# Patient Record
Sex: Male | Born: 1975 | Race: Asian | Hispanic: No | Marital: Married
Health system: Southern US, Community
[De-identification: ages and names within clinical notes are randomized; demographics above are authoritative.]

---

## 2009-12-01 ENCOUNTER — Ambulatory Visit: Payer: Self-pay | Admitting: Gastroenterology

## 2010-01-12 ENCOUNTER — Ambulatory Visit: Payer: Self-pay | Admitting: Gastroenterology

## 2011-08-15 ENCOUNTER — Other Ambulatory Visit: Payer: Self-pay | Admitting: *Deleted

## 2011-08-15 DIAGNOSIS — B181 Chronic viral hepatitis B without delta-agent: Secondary | ICD-10-CM

## 2011-08-17 ENCOUNTER — Ambulatory Visit
Admission: RE | Admit: 2011-08-17 | Discharge: 2011-08-17 | Disposition: A | Discharge status: Home / Self Care | Payer: BC Managed Care – PPO | Source: Ambulatory Visit | Attending: *Deleted | Admitting: *Deleted

## 2011-08-17 DIAGNOSIS — B181 Chronic viral hepatitis B without delta-agent: Secondary | ICD-10-CM

## 2011-09-20 ENCOUNTER — Ambulatory Visit: Payer: BC Managed Care – PPO | Admitting: Gastroenterology

## 2013-01-20 ENCOUNTER — Other Ambulatory Visit: Payer: Self-pay | Admitting: Internal Medicine

## 2013-01-20 DIAGNOSIS — K760 Fatty (change of) liver, not elsewhere classified: Secondary | ICD-10-CM

## 2013-01-20 DIAGNOSIS — B181 Chronic viral hepatitis B without delta-agent: Secondary | ICD-10-CM

## 2013-01-21 ENCOUNTER — Ambulatory Visit
Admission: RE | Admit: 2013-01-21 | Discharge: 2013-01-21 | Disposition: A | Discharge status: Home / Self Care | Payer: BC Managed Care – PPO | Source: Ambulatory Visit | Attending: Internal Medicine | Admitting: Internal Medicine

## 2013-01-21 DIAGNOSIS — B181 Chronic viral hepatitis B without delta-agent: Secondary | ICD-10-CM

## 2013-01-21 DIAGNOSIS — K760 Fatty (change of) liver, not elsewhere classified: Secondary | ICD-10-CM

## 2015-05-31 ENCOUNTER — Other Ambulatory Visit: Payer: Self-pay | Admitting: Gastroenterology

## 2015-05-31 DIAGNOSIS — B181 Chronic viral hepatitis B without delta-agent: Secondary | ICD-10-CM

## 2015-06-07 ENCOUNTER — Ambulatory Visit
Admission: RE | Admit: 2015-06-07 | Discharge: 2015-06-07 | Disposition: A | Discharge status: Home / Self Care | Payer: BC Managed Care – PPO | Source: Ambulatory Visit | Attending: Gastroenterology | Admitting: Gastroenterology

## 2015-06-07 DIAGNOSIS — B181 Chronic viral hepatitis B without delta-agent: Secondary | ICD-10-CM

## 2017-07-09 ENCOUNTER — Other Ambulatory Visit: Payer: Self-pay | Admitting: Family Medicine

## 2017-07-09 DIAGNOSIS — B181 Chronic viral hepatitis B without delta-agent: Secondary | ICD-10-CM

## 2017-09-02 ENCOUNTER — Ambulatory Visit
Admission: RE | Admit: 2017-09-02 | Discharge: 2017-09-02 | Disposition: A | Discharge status: Home / Self Care | Payer: BC Managed Care – PPO | Source: Ambulatory Visit | Attending: Family Medicine | Admitting: Family Medicine

## 2017-09-02 DIAGNOSIS — B181 Chronic viral hepatitis B without delta-agent: Secondary | ICD-10-CM

## 2019-12-05 ENCOUNTER — Ambulatory Visit: Payer: BC Managed Care – PPO | Attending: Internal Medicine

## 2019-12-05 DIAGNOSIS — Z23 Encounter for immunization: Secondary | ICD-10-CM | POA: Insufficient documentation

## 2019-12-05 NOTE — Progress Notes (Signed)
   Cov[facility] Vaccination [facility]  Name:  Bedford Milles    MRN: [id number] DOB: [birth year]  12/05/2019  Mr. Macari was observed post Covid-19 immunization for 15 minutes without incident. He was provided with Vaccine Information Sheet and instruction to access the V-Safe system.   Mr. Dedo was instructed to call 911 with any severe reactions post vaccine: Marland Kitchen Difficulty breathing  . Swelling of face and throat  . A fast heartbeat  . A bad rash all over body  . Dizziness and weakness   Immunizations Administered    Name Date Dose VIS Date Route   Pfizer COVID-19 Vaccine 12/05/2019 11:19 AM 0.3 mL 09/11/2019 Intramuscular   Manufacturer: [place]   Lot: [id number]   [place]: [id number]

## 2019-12-26 ENCOUNTER — Ambulatory Visit: Payer: BC Managed Care – PPO | Attending: Internal Medicine

## 2019-12-26 DIAGNOSIS — Z23 Encounter for immunization: Secondary | ICD-10-CM

## 2019-12-26 NOTE — Progress Notes (Signed)
   Covid-19 Vaccination Clinic  Name:  Robert Cannon    MRN: [id number] DOB: 11-Aug-1976  12/26/2019  [name] was observed post Covid-19 immunization for 15 minutes without incident. He was provided with Vaccine Information Sheet and instruction to access the V-Safe system.   [name] was instructed to call 911 with any severe reactions post vaccine: Marland Kitchen Difficulty breathing  . Swelling of face and throat  . A fast heartbeat  . A bad rash all over body  . Dizziness and weakness   Immunizations Administered    Name Date Dose VIS Date Route   Pfizer COVID-19 Vaccine 12/26/2019  9:46 AM 0.3 mL 09/11/2019 Intramuscular   Manufacturer: [place]   Lot: [id number]   [place]: [id number]

## 2020-07-02 ENCOUNTER — Other Ambulatory Visit: Payer: Self-pay | Admitting: Critical Care Medicine

## 2020-07-02 ENCOUNTER — Other Ambulatory Visit: Payer: BC Managed Care – PPO

## 2020-07-02 DIAGNOSIS — Z20822 Contact with and (suspected) exposure to covid-19: Secondary | ICD-10-CM

## 2020-07-03 LAB — NOVEL CORONAVIRUS, NAA: SARS-CoV-2, NAA: NOT DETECTED

## 2020-07-03 LAB — SARS-COV-2, NAA 2 DAY TAT

## 2020-11-08 ENCOUNTER — Other Ambulatory Visit: Payer: Self-pay | Admitting: Nurse Practitioner

## 2020-11-08 DIAGNOSIS — B181 Chronic viral hepatitis B without delta-agent: Secondary | ICD-10-CM

## 2020-11-25 ENCOUNTER — Other Ambulatory Visit: Payer: BC Managed Care – PPO

## 2020-12-08 ENCOUNTER — Ambulatory Visit
Admission: RE | Admit: 2020-12-08 | Discharge: 2020-12-08 | Disposition: A | Discharge status: Home / Self Care | Payer: BC Managed Care – PPO | Source: Ambulatory Visit | Attending: Nurse Practitioner | Admitting: Nurse Practitioner

## 2020-12-08 ENCOUNTER — Other Ambulatory Visit: Payer: Self-pay

## 2020-12-08 DIAGNOSIS — B181 Chronic viral hepatitis B without delta-agent: Secondary | ICD-10-CM

## 2021-05-24 ENCOUNTER — Other Ambulatory Visit: Payer: Self-pay | Admitting: Nurse Practitioner

## 2021-05-24 DIAGNOSIS — B181 Chronic viral hepatitis B without delta-agent: Secondary | ICD-10-CM

## 2021-06-17 ENCOUNTER — Other Ambulatory Visit: Payer: BC Managed Care – PPO

## 2021-07-05 ENCOUNTER — Ambulatory Visit
Admission: RE | Admit: 2021-07-05 | Discharge: 2021-07-05 | Disposition: A | Discharge status: Home / Self Care | Payer: BC Managed Care – PPO | Source: Ambulatory Visit | Attending: Nurse Practitioner | Admitting: Nurse Practitioner

## 2021-07-05 ENCOUNTER — Other Ambulatory Visit: Payer: Self-pay

## 2021-07-05 DIAGNOSIS — B181 Chronic viral hepatitis B without delta-agent: Secondary | ICD-10-CM

## 2021-07-05 MED ORDER — GADOBENATE DIMEGLUMINE 529 MG/ML IV SOLN
20.0000 mL | Freq: Once | INTRAVENOUS | Status: AC | PRN
  Administered 2021-07-05: 20 mL via INTRAVENOUS

## 2022-01-05 ENCOUNTER — Other Ambulatory Visit: Payer: Self-pay | Admitting: Nurse Practitioner

## 2022-01-05 DIAGNOSIS — D376 Neoplasm of uncertain behavior of liver, gallbladder and bile ducts: Secondary | ICD-10-CM

## 2022-01-31 ENCOUNTER — Ambulatory Visit
Admission: RE | Admit: 2022-01-31 | Discharge: 2022-01-31 | Disposition: A | Discharge status: Home / Self Care | Payer: BC Managed Care – PPO | Source: Ambulatory Visit | Attending: Nurse Practitioner | Admitting: Nurse Practitioner

## 2022-01-31 DIAGNOSIS — D376 Neoplasm of uncertain behavior of liver, gallbladder and bile ducts: Secondary | ICD-10-CM

## 2022-01-31 MED ORDER — GADOBENATE DIMEGLUMINE 529 MG/ML IV SOLN
20.0000 mL | Freq: Once | INTRAVENOUS | Status: AC | PRN
  Administered 2022-01-31: 20 mL via INTRAVENOUS

## 2022-02-05 ENCOUNTER — Other Ambulatory Visit: Payer: BC Managed Care – PPO

## 2022-07-11 ENCOUNTER — Other Ambulatory Visit: Payer: Self-pay | Admitting: Nurse Practitioner

## 2022-07-11 DIAGNOSIS — K7401 Hepatic fibrosis, early fibrosis: Secondary | ICD-10-CM

## 2022-07-11 DIAGNOSIS — B181 Chronic viral hepatitis B without delta-agent: Secondary | ICD-10-CM

## 2022-07-11 DIAGNOSIS — K760 Fatty (change of) liver, not elsewhere classified: Secondary | ICD-10-CM

## 2022-09-17 ENCOUNTER — Other Ambulatory Visit: Payer: BC Managed Care – PPO

## 2022-09-28 ENCOUNTER — Ambulatory Visit
Admission: RE | Admit: 2022-09-28 | Discharge: 2022-09-28 | Disposition: A | Discharge status: Home / Self Care | Payer: BC Managed Care – PPO | Source: Ambulatory Visit | Attending: Nurse Practitioner | Admitting: Nurse Practitioner

## 2022-09-28 DIAGNOSIS — K7401 Hepatic fibrosis, early fibrosis: Secondary | ICD-10-CM

## 2022-09-28 DIAGNOSIS — K760 Fatty (change of) liver, not elsewhere classified: Secondary | ICD-10-CM

## 2022-09-28 DIAGNOSIS — B181 Chronic viral hepatitis B without delta-agent: Secondary | ICD-10-CM

## 2023-01-17 ENCOUNTER — Other Ambulatory Visit: Payer: Self-pay | Admitting: Nurse Practitioner

## 2023-01-17 DIAGNOSIS — D376 Neoplasm of uncertain behavior of liver, gallbladder and bile ducts: Secondary | ICD-10-CM

## 2023-01-17 DIAGNOSIS — B181 Chronic viral hepatitis B without delta-agent: Secondary | ICD-10-CM

## 2023-04-12 ENCOUNTER — Ambulatory Visit
Admission: RE | Admit: 2023-04-12 | Discharge: 2023-04-12 | Disposition: A | Discharge status: Home / Self Care | Payer: BC Managed Care – PPO | Source: Ambulatory Visit | Attending: Nurse Practitioner | Admitting: Nurse Practitioner

## 2023-04-12 DIAGNOSIS — D376 Neoplasm of uncertain behavior of liver, gallbladder and bile ducts: Secondary | ICD-10-CM

## 2023-04-12 DIAGNOSIS — B181 Chronic viral hepatitis B without delta-agent: Secondary | ICD-10-CM

## 2023-04-12 MED ORDER — GADOPICLENOL 0.5 MMOL/ML IV SOLN
10.0000 mL | Freq: Once | INTRAVENOUS | Status: AC | PRN
  Administered 2023-04-12: 10 mL via INTRAVENOUS

## 2023-08-05 IMAGING — MR MR ABDOMEN WO/W CM
12 of 19 series · 27 of 48 positions shown · IV contrast (multihance)
Comparison: MRI abdomen 07/05/2021

CLINICAL DATA: Hepatitis, liver lesion

EXAM:
MRI ABDOMEN WITHOUT AND WITH CONTRAST
TECHNIQUE: Multiplanar multisequence MR imaging of the abdomen was performed
both before and after the administration of intravenous contrast.
CONTRAST:  20mL MULTIHANCE GADOBENATE DIMEGLUMINE 529 MG/ML IV SOLN

[Series 3: cor haste · coronal · 5.0mm · 0.68mm/px · 1 of 34 slices shown]
[im 1/34]
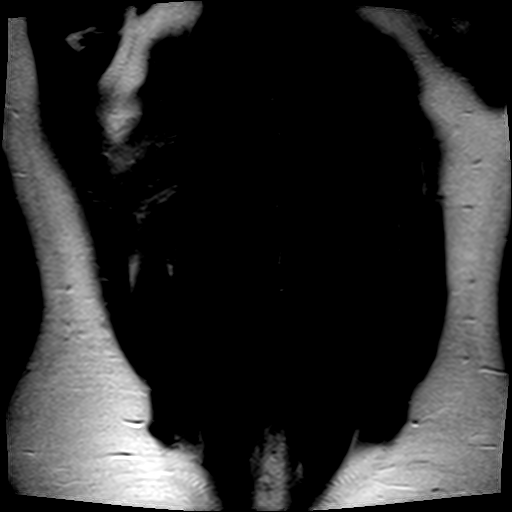

[Series 4: axial haste · axial · 6.0mm · 0.68mm/px · 1 of 36 slices shown]
[im 1/36]
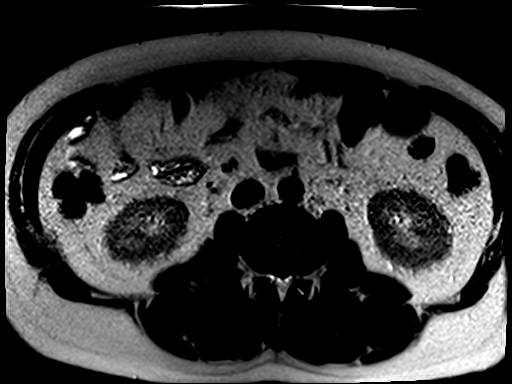

[Series 5: T1 · axial · 6.0mm · 0.70mm/px · z∈[-130,+127]mm · 3 of 80 slices shown]
[im 1/80]
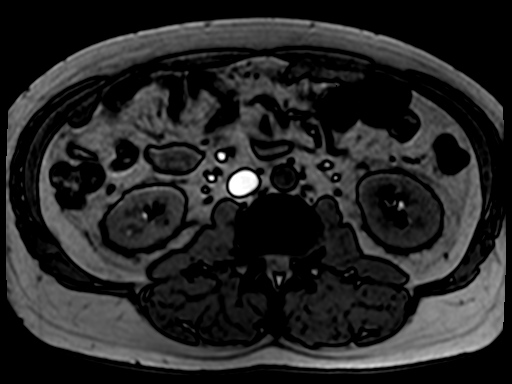
[im 40/80]
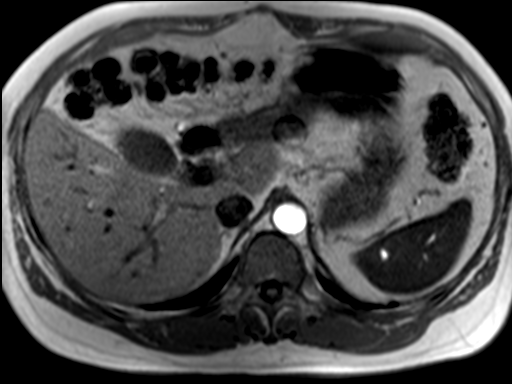
[im 80/80]
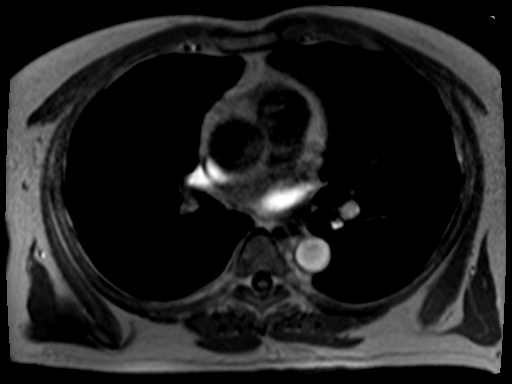

[Series 6: bSSFP · axial · 4.0mm · 0.70mm/px · z∈[-118,+122]mm · 2 of 61 slices shown]
[im 1/61]
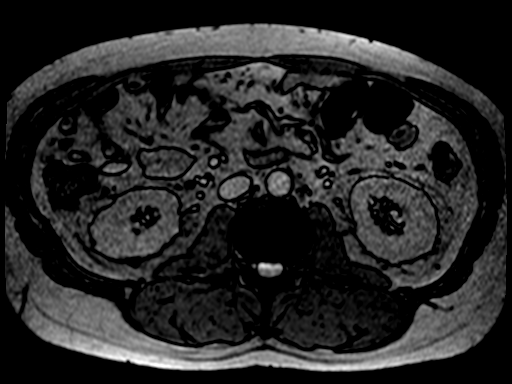
[im 61/61]
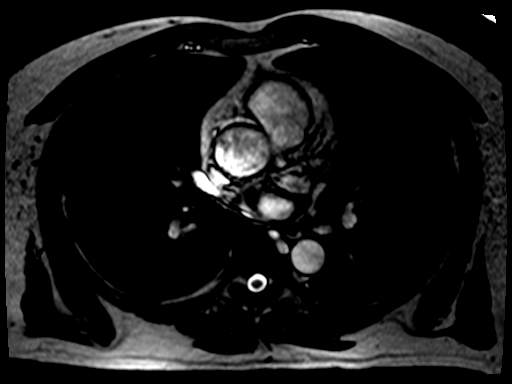

[Series 7: T2 fat-sat · axial · 6.0mm · 1.09mm/px · 1 of 32 slices shown]
[im 1/32]
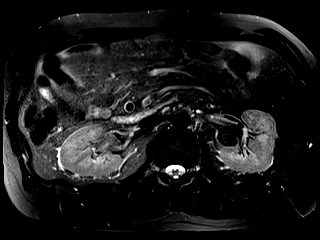

[Series 8: [id number]_trig · axial · 6.0mm · 1.93mm/px · z∈[-79,+144]mm · 3 of 96 slices shown]
[im 1/96]
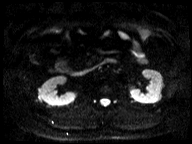
[im 48/96]
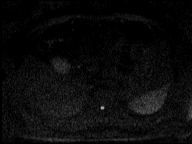
[im 96/96]
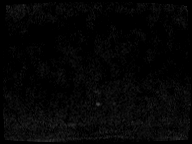

[Series 9: [id number]_trig_adc · axial · 6.0mm · 1.93mm/px · 1 of 32 slices shown]
[im 1/32]
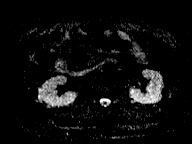

[Series 10: T1 dynamic · axial · non-contrast · 2.5mm · 0.74mm/px · z∈[-111,+127]mm · 3 of 96 slices shown]
[im 1/96]
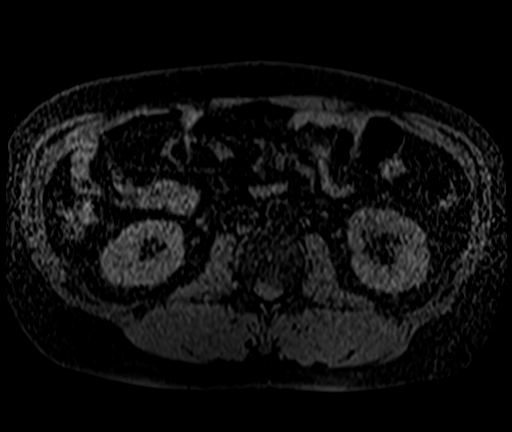
[im 48/96]
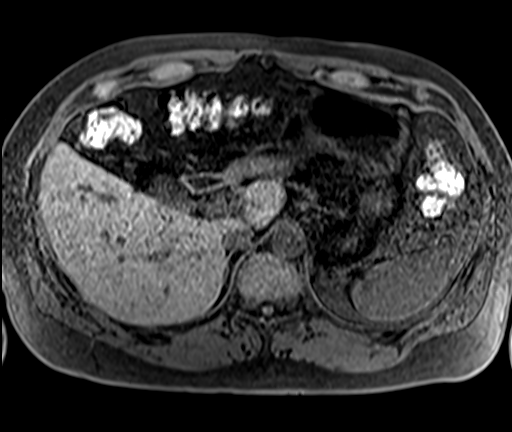
[im 96/96]
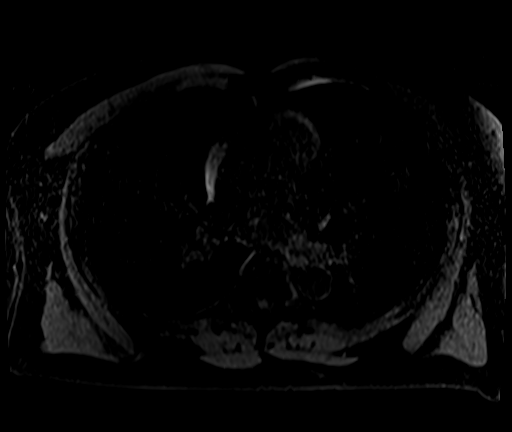

[Series 11: T1 dynamic post-contrast · axial · 2.5mm · 0.74mm/px · z∈[-111,+127]mm · 3 of 96 slices shown (1 of 4)]
[im 1/96]
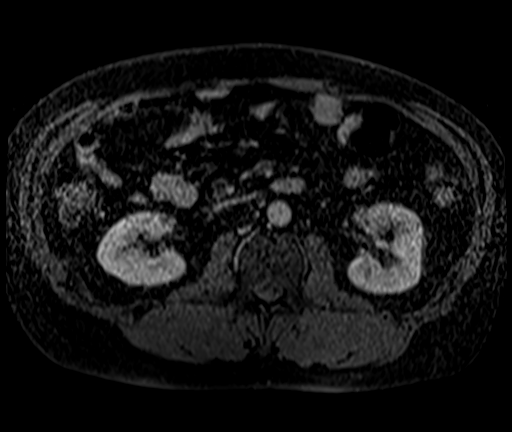
[im 48/96]
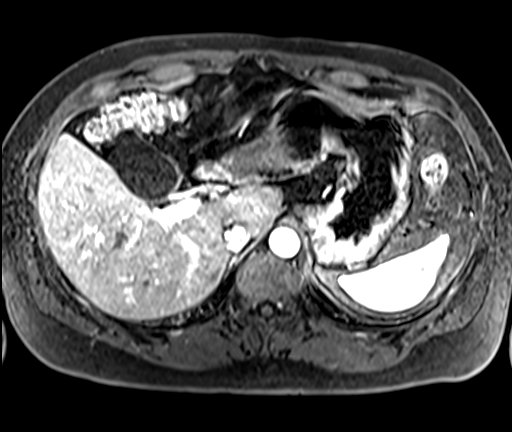
[im 96/96]
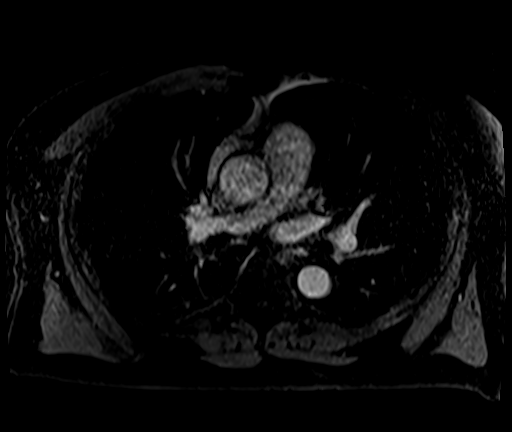

[Series 12: T1 dynamic post-contrast · axial · 2.5mm · 0.74mm/px · z∈[-111,+127]mm · 3 of 96 slices shown (2 of 4)]
[im 1/96]
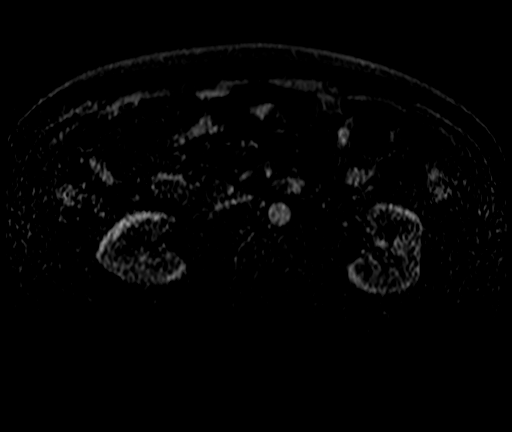
[im 48/96]
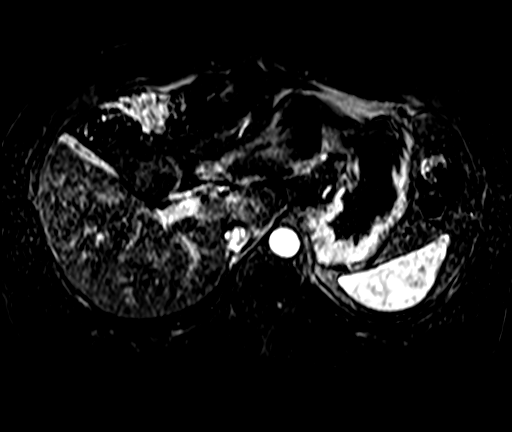
[im 96/96]
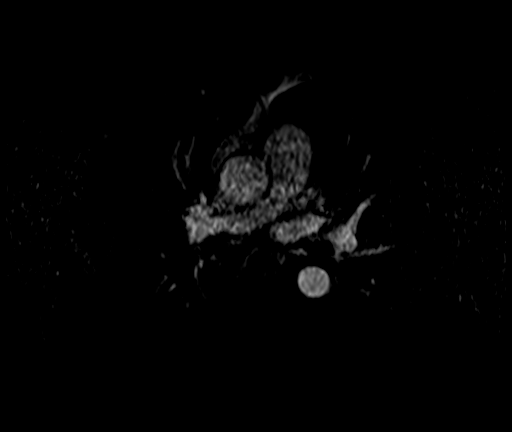

[Series 13: T1 dynamic post-contrast · axial · 2.5mm · 0.74mm/px · z∈[-111,+127]mm · 3 of 96 slices shown (3 of 4)]
[im 1/96]
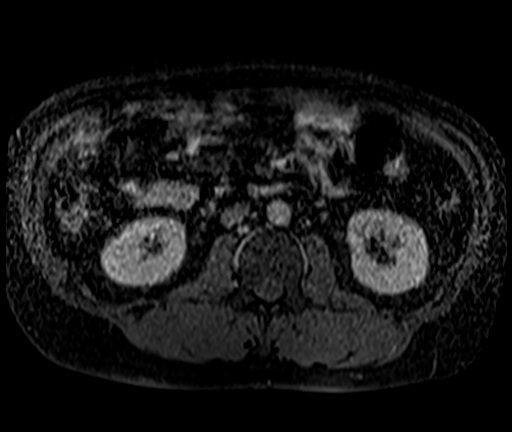
[im 48/96]
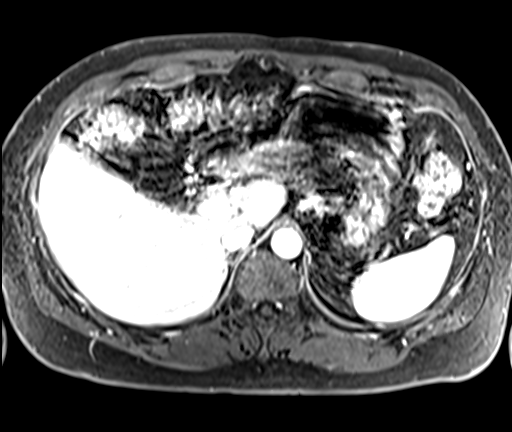
[im 96/96]
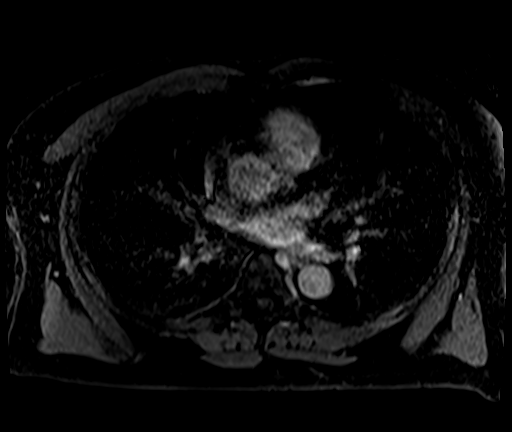

[Series 14: T1 dynamic post-contrast · axial · 2.5mm · 0.74mm/px · z∈[-111,+127]mm · 3 of 96 slices shown (4 of 4)]
[im 1/96]
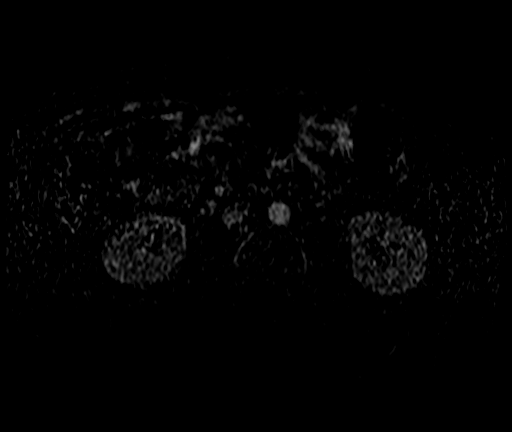
[im 48/96]
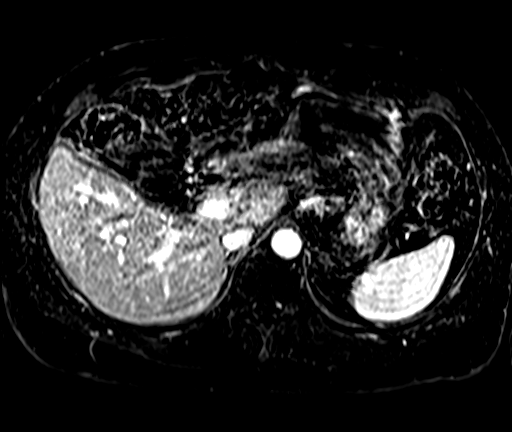
[im 96/96]
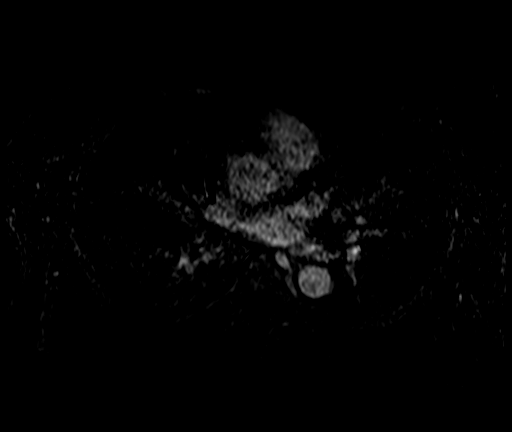

[27 of 48 positions shown; findings below may reference images not displayed]

FINDINGS: Lower chest: No acute findings.

Hepatobiliary: Liver is enlarged measuring 19 cm in length. Evidence
of mild hepatic steatosis. No significant nodularity of the liver
contour appreciated. Stable size and appearance of a 1 cm early
arterial enhancing lesion in the right hepatic lobe adjacent to the
gallbladder fossa which does not washout or demonstrate an enhancing
capsule. No new hepatic lesions identified.

Pancreas: No mass, inflammatory changes, or other parenchymal
abnormality identified.

Spleen:  Within normal limits in size and appearance.

Adrenals/Urinary Tract: No masses identified. No evidence of
hydronephrosis.

Stomach/Bowel: Visualized portions within the abdomen are
unremarkable.

Vascular/Lymphatic: No pathologically enlarged lymph nodes
identified. No abdominal aortic aneurysm demonstrated.

Other:  No ascites.

Musculoskeletal: No suspicious bone lesions identified.
IMPRESSION: 1. Stable 1 cm enhancing lesion in the right hepatic lobe adjacent
to the gallbladder fossa, LR 2. Consider follow-up MRI in 1 year and
resume routine surveillance.
2. Hepatomegaly and mild hepatic steatosis.

## 2023-11-18 ENCOUNTER — Other Ambulatory Visit: Payer: Self-pay | Admitting: Nurse Practitioner

## 2023-11-18 DIAGNOSIS — B181 Chronic viral hepatitis B without delta-agent: Secondary | ICD-10-CM

## 2023-11-18 DIAGNOSIS — D376 Neoplasm of uncertain behavior of liver, gallbladder and bile ducts: Secondary | ICD-10-CM

## 2023-11-18 DIAGNOSIS — K760 Fatty (change of) liver, not elsewhere classified: Secondary | ICD-10-CM

## 2023-11-18 DIAGNOSIS — K7401 Hepatic fibrosis, early fibrosis: Secondary | ICD-10-CM

## 2023-12-17 ENCOUNTER — Ambulatory Visit
Admission: RE | Admit: 2023-12-17 | Discharge: 2023-12-17 | Disposition: A | Discharge status: Home / Self Care | Payer: Self-pay | Source: Ambulatory Visit | Attending: Nurse Practitioner | Admitting: Nurse Practitioner

## 2023-12-17 DIAGNOSIS — B181 Chronic viral hepatitis B without delta-agent: Secondary | ICD-10-CM

## 2023-12-17 DIAGNOSIS — K760 Fatty (change of) liver, not elsewhere classified: Secondary | ICD-10-CM

## 2023-12-17 DIAGNOSIS — K7401 Hepatic fibrosis, early fibrosis: Secondary | ICD-10-CM

## 2023-12-17 DIAGNOSIS — D376 Neoplasm of uncertain behavior of liver, gallbladder and bile ducts: Secondary | ICD-10-CM

## 2023-12-17 MED ORDER — GADOPICLENOL 0.5 MMOL/ML IV SOLN
10.0000 mL | Freq: Once | INTRAVENOUS | Status: AC | PRN
  Administered 2023-12-17: 10 mL via INTRAVENOUS

## 2024-07-09 ENCOUNTER — Other Ambulatory Visit: Payer: Self-pay | Admitting: Nurse Practitioner

## 2024-07-09 DIAGNOSIS — B181 Chronic viral hepatitis B without delta-agent: Secondary | ICD-10-CM

## 2024-07-09 DIAGNOSIS — D376 Neoplasm of uncertain behavior of liver, gallbladder and bile ducts: Secondary | ICD-10-CM

## 2024-08-15 ENCOUNTER — Other Ambulatory Visit

## 2024-08-17 ENCOUNTER — Ambulatory Visit
Admission: RE | Admit: 2024-08-17 | Discharge: 2024-08-17 | Disposition: A | Discharge status: Home / Self Care | Source: Ambulatory Visit | Attending: Nurse Practitioner | Admitting: Nurse Practitioner

## 2024-08-17 DIAGNOSIS — D376 Neoplasm of uncertain behavior of liver, gallbladder and bile ducts: Secondary | ICD-10-CM

## 2024-08-17 DIAGNOSIS — B181 Chronic viral hepatitis B without delta-agent: Secondary | ICD-10-CM

## 2024-08-17 MED ORDER — GADOPICLENOL 0.5 MMOL/ML IV SOLN
10.0000 mL | Freq: Once | INTRAVENOUS | Status: AC | PRN
  Administered 2024-08-17: 10 mL via INTRAVENOUS
